# Patient Record
Sex: Male | Born: 1950 | Race: Black or African American | Hispanic: No | Marital: Married | Smoking: Never smoker
Health system: Southern US, Community
[De-identification: ages and names within clinical notes are randomized; demographics above are authoritative.]

## PROBLEM LIST (undated history)

## (undated) DIAGNOSIS — I10 Essential (primary) hypertension: Secondary | ICD-10-CM

## (undated) DIAGNOSIS — K219 Gastro-esophageal reflux disease without esophagitis: Secondary | ICD-10-CM

## (undated) DIAGNOSIS — N189 Chronic kidney disease, unspecified: Secondary | ICD-10-CM

## (undated) DIAGNOSIS — E785 Hyperlipidemia, unspecified: Secondary | ICD-10-CM

## (undated) DIAGNOSIS — D494 Neoplasm of unspecified behavior of bladder: Secondary | ICD-10-CM

---

## 2002-04-16 ENCOUNTER — Encounter: Admission: RE | Admit: 2002-04-16 | Discharge: 2002-07-15 | Payer: Self-pay | Admitting: Family Medicine

## 2012-04-21 ENCOUNTER — Other Ambulatory Visit (HOSPITAL_COMMUNITY)
Admission: RE | Admit: 2012-04-21 | Discharge: 2012-04-21 | Disposition: A | Discharge status: Home / Self Care | Payer: Managed Care, Other (non HMO) | Source: Ambulatory Visit | Attending: Otolaryngology | Admitting: Otolaryngology

## 2012-04-21 DIAGNOSIS — R220 Localized swelling, mass and lump, head: Secondary | ICD-10-CM | POA: Insufficient documentation

## 2015-07-24 ENCOUNTER — Other Ambulatory Visit: Payer: Self-pay | Admitting: Nephrology

## 2015-07-24 DIAGNOSIS — N183 Chronic kidney disease, stage 3 unspecified: Secondary | ICD-10-CM

## 2015-07-27 ENCOUNTER — Ambulatory Visit
Admission: RE | Admit: 2015-07-27 | Discharge: 2015-07-27 | Disposition: A | Discharge status: Home / Self Care | Payer: 59 | Source: Ambulatory Visit | Attending: Nephrology | Admitting: Nephrology

## 2015-07-27 DIAGNOSIS — N183 Chronic kidney disease, stage 3 unspecified: Secondary | ICD-10-CM

## 2017-07-19 IMAGING — US US RENAL
1 series · 14 of 25 positions shown · non-contrast
Comparison: None in PACs

CLINICAL DATA: Chronic renal insufficiency stage III

EXAM:
RENAL / URINARY TRACT ULTRASOUND COMPLETE

[Series 1: us renal · 0.26mm/px · 14 of 62 slices shown]
[im 1/62]
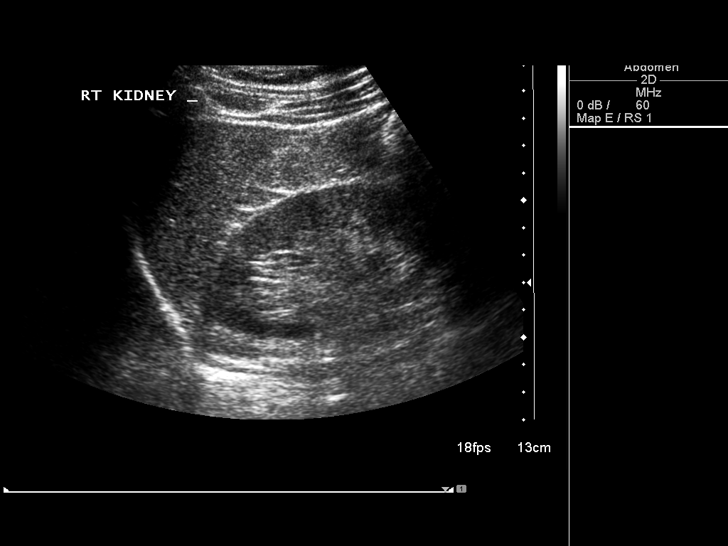
[im 6/62]
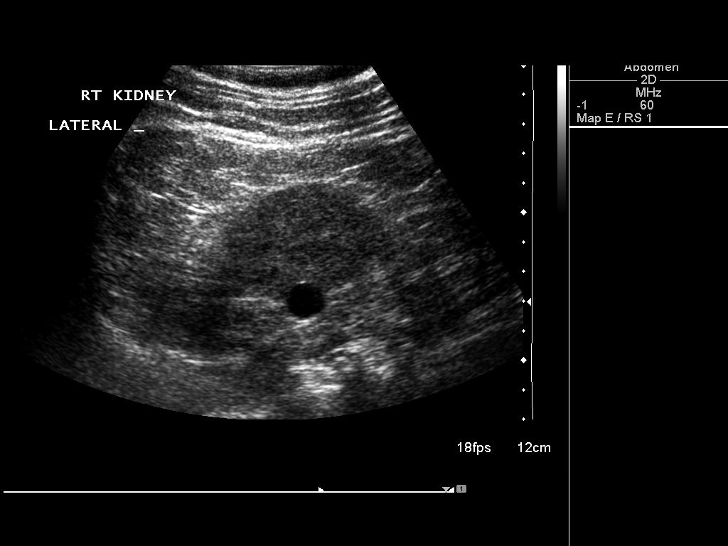
[im 11/62]
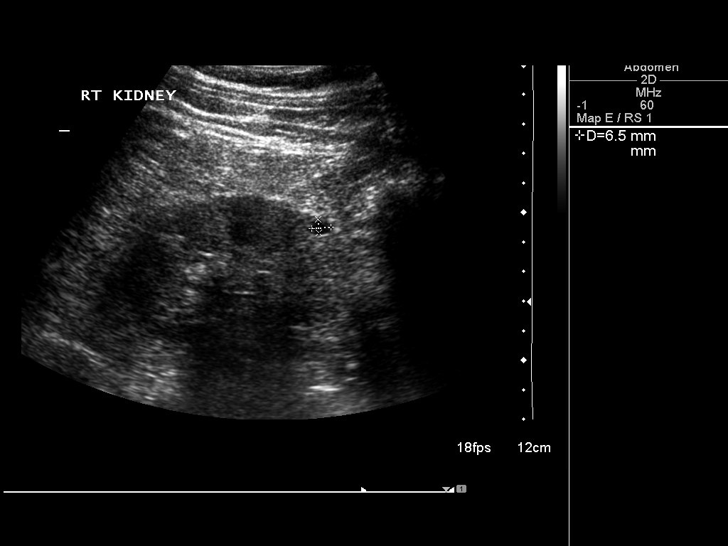
[im 16/62]
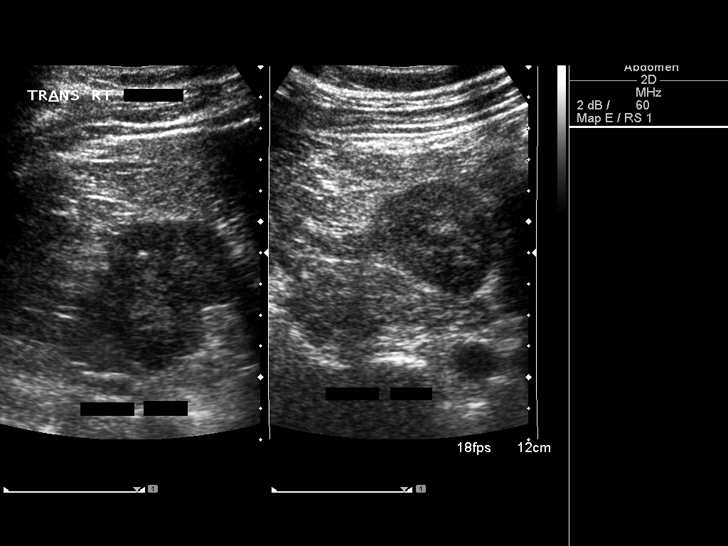
[im 21/62]
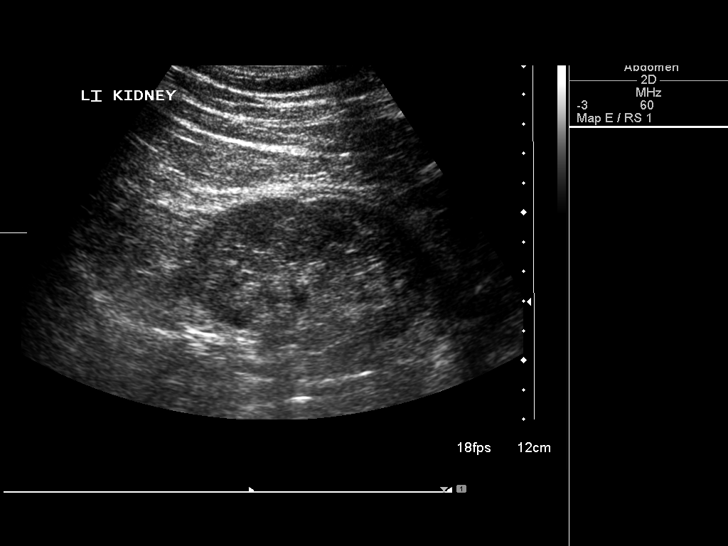
[im 23/62]
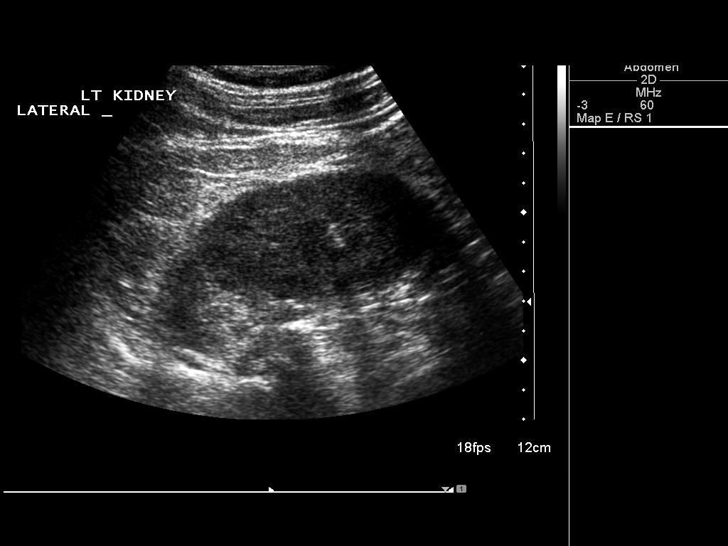
[im 28/62]
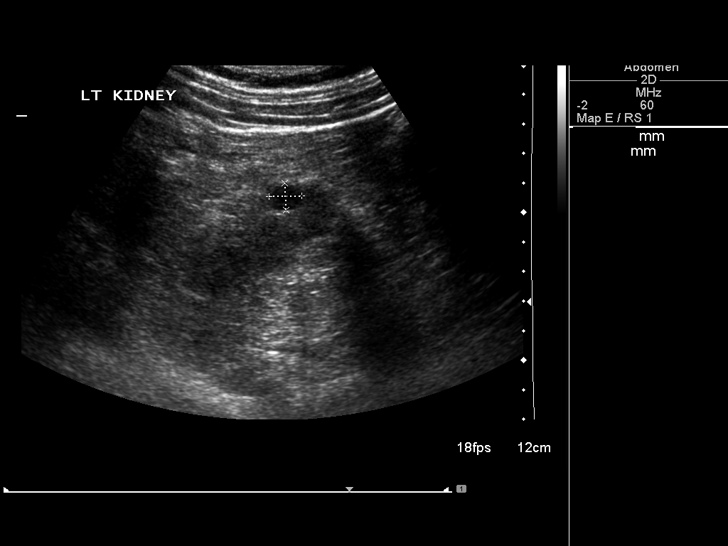
[im 34/62]
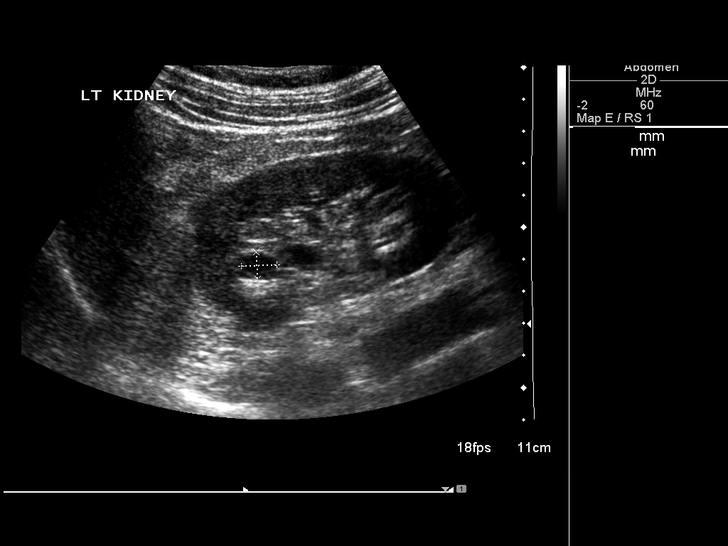
[im 39/62]
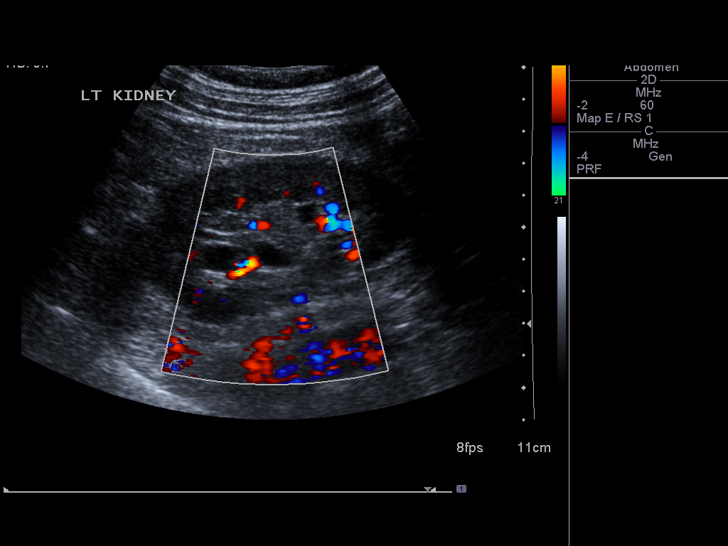
[im 41/62]
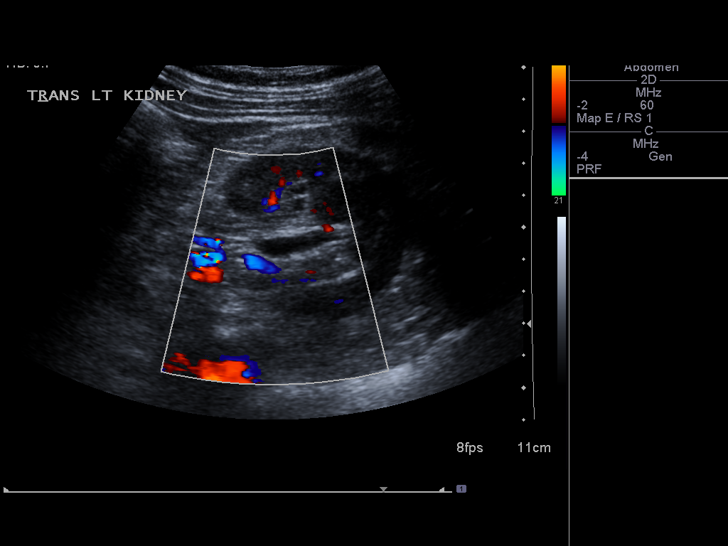
[im 46/62]
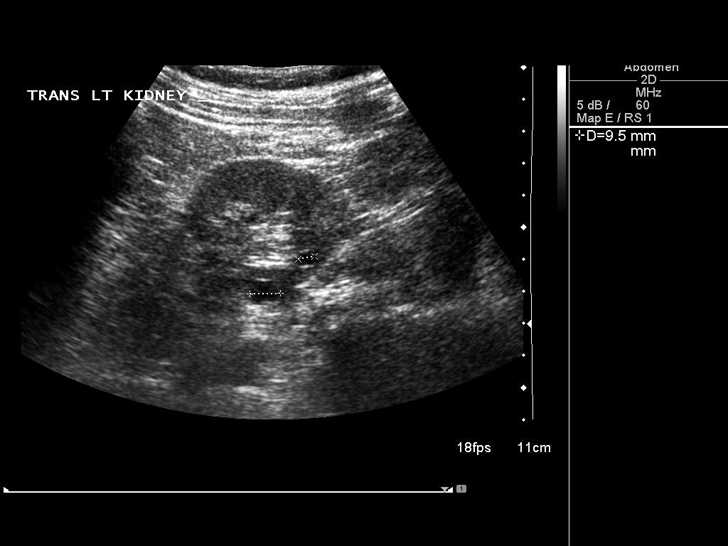
[im 51/62]
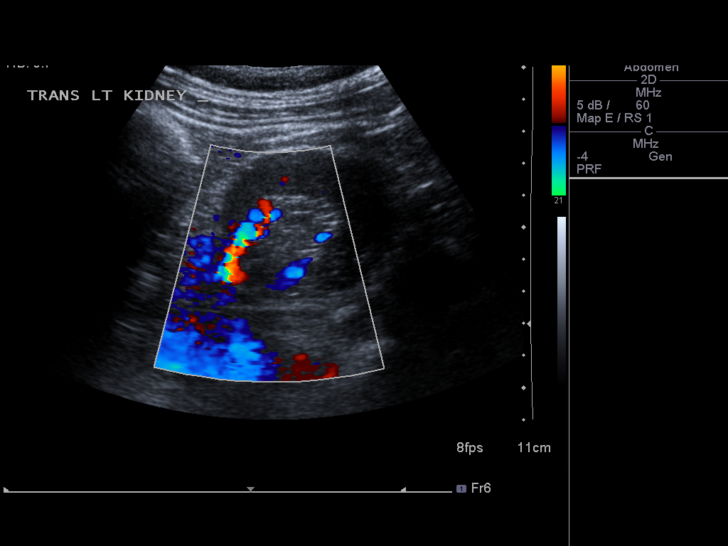
[im 56/62]
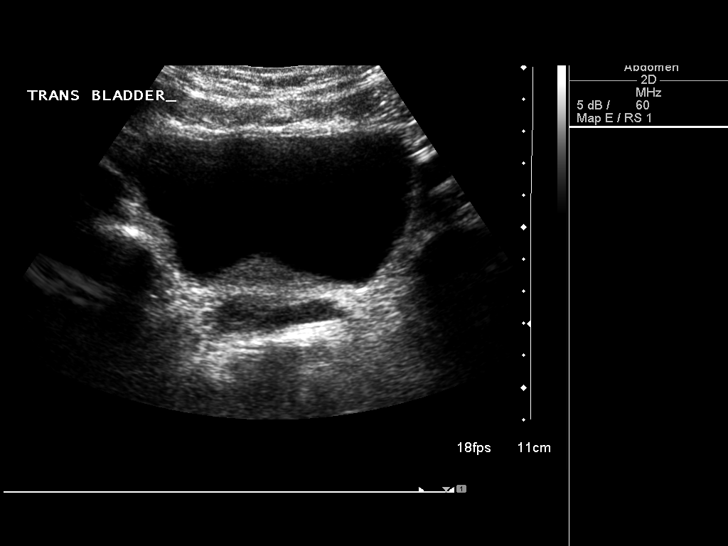
[im 62/62]
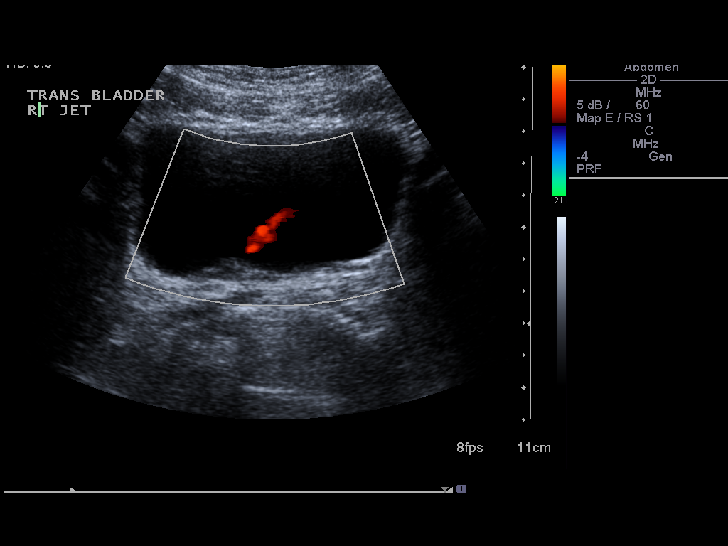

[14 of 25 positions shown; findings below may reference images not displayed]

FINDINGS: Right Kidney:

Length: 9.1 cm. The renal cortical echotexture remains lower than
that of the adjacent liver. There are 2 cortical cysts, 1 measuring
1.4 cm in greatest dimension in the other measuring 0.7 cm in
greatest dimension. There is no hydronephrosis.

Left Kidney:

Length: 9.3 cm. The renal cortical echotexture is similar to that of
the right kidney. There are cortical cysts. The largest measures
cm in diameter. There is no hydronephrosis.

Bladder:

Bilateral ureteral jets are observed. The partially distended
urinary bladder appears normal.
IMPRESSION: 1. The renal cortical echotexture remains lower than the adjacent
liver. There is no hydronephrosis.
2. There are simple appearing cysts in both kidneys measuring up to
1.4 cm in diameter.
3. The urinary bladder is unremarkable.

## 2019-04-02 ENCOUNTER — Ambulatory Visit: Payer: Medicare Other | Attending: Internal Medicine

## 2019-04-02 ENCOUNTER — Other Ambulatory Visit: Payer: Self-pay | Admitting: Urology

## 2019-04-02 DIAGNOSIS — Z23 Encounter for immunization: Secondary | ICD-10-CM

## 2019-04-02 NOTE — Progress Notes (Signed)
   Covid-19 Vaccination Clinic  Name:  LILA HRABE    MRN: ID:[id number] DOB: Nov 16, 1950  04/02/2019  Mr. Tomerlin was observed post Covid-19 immunization for 15 minutes without incident. He was provided with Vaccine Information Sheet and instruction to access the V-Safe system.   Mr. Persing was instructed to call 911 with any severe reactions post vaccine: Marland Kitchen Difficulty breathing  . Swelling of face and throat  . A fast heartbeat  . A bad rash all over body  . Dizziness and weakness   Immunizations Administered    Name Date Dose VIS Date Route   Pfizer COVID-19 Vaccine 04/02/2019 12:19 PM 0.3 mL 01/08/2019 Intramuscular   Manufacturer: [place]   Lot: [id number]   [place]: [id number]

## 2019-04-08 ENCOUNTER — Encounter (HOSPITAL_BASED_OUTPATIENT_CLINIC_OR_DEPARTMENT_OTHER): Payer: Self-pay | Admitting: Urology

## 2019-04-08 ENCOUNTER — Other Ambulatory Visit: Payer: Self-pay

## 2019-04-08 NOTE — Progress Notes (Signed)
Spoke w/ via phone for pre-op interview---Evan Ryan needs dos----   I stat 8, ekg             COVID test ------04-12-2019 at [facility] at -------915 am 04-15-2019 NPO after ------midnight Medications to take morning of surgery -----none Diabetic medication -----n/a Patient Special Instructions ----- Pre-Op special Istructions ----- Patient verbalized understanding of instructions that were given at this phone interview. Patient denies shortness of breath, chest pain, fever, cough a this phone interview.

## 2019-04-12 ENCOUNTER — Other Ambulatory Visit (HOSPITAL_COMMUNITY)
Admission: RE | Admit: 2019-04-12 | Discharge: 2019-04-12 | Disposition: A | Discharge status: Home / Self Care | Payer: Medicare Other | Source: Ambulatory Visit | Attending: Urology | Admitting: Urology

## 2019-04-12 DIAGNOSIS — Z20822 Contact with and (suspected) exposure to covid-19: Secondary | ICD-10-CM | POA: Insufficient documentation

## 2019-04-12 DIAGNOSIS — Z01812 Encounter for preprocedural laboratory examination: Secondary | ICD-10-CM | POA: Insufficient documentation

## 2019-04-12 LAB — SARS CORONAVIRUS 2 (TAT 6-24 HRS): SARS Coronavirus 2: NEGATIVE

## 2019-04-15 ENCOUNTER — Ambulatory Visit (HOSPITAL_BASED_OUTPATIENT_CLINIC_OR_DEPARTMENT_OTHER): Payer: Medicare Other | Admitting: Anesthesiology

## 2019-04-15 ENCOUNTER — Other Ambulatory Visit: Payer: Self-pay

## 2019-04-15 ENCOUNTER — Encounter (HOSPITAL_BASED_OUTPATIENT_CLINIC_OR_DEPARTMENT_OTHER): Payer: Self-pay | Admitting: Urology

## 2019-04-15 ENCOUNTER — Encounter (HOSPITAL_BASED_OUTPATIENT_CLINIC_OR_DEPARTMENT_OTHER)
Admission: RE | Disposition: A | Discharge status: Home / Self Care | Payer: Self-pay | Source: Home / Self Care | Attending: Urology

## 2019-04-15 ENCOUNTER — Ambulatory Visit (HOSPITAL_BASED_OUTPATIENT_CLINIC_OR_DEPARTMENT_OTHER)
Admission: RE | Admit: 2019-04-15 | Discharge: 2019-04-15 | Disposition: A | Discharge status: Home / Self Care | Payer: Medicare Other | Attending: Urology | Admitting: Urology

## 2019-04-15 DIAGNOSIS — R911 Solitary pulmonary nodule: Secondary | ICD-10-CM | POA: Insufficient documentation

## 2019-04-15 DIAGNOSIS — N3080 Other cystitis without hematuria: Secondary | ICD-10-CM | POA: Insufficient documentation

## 2019-04-15 DIAGNOSIS — K219 Gastro-esophageal reflux disease without esophagitis: Secondary | ICD-10-CM | POA: Diagnosis not present

## 2019-04-15 DIAGNOSIS — C672 Malignant neoplasm of lateral wall of bladder: Secondary | ICD-10-CM | POA: Diagnosis not present

## 2019-04-15 DIAGNOSIS — Z79899 Other long term (current) drug therapy: Secondary | ICD-10-CM | POA: Diagnosis not present

## 2019-04-15 DIAGNOSIS — I10 Essential (primary) hypertension: Secondary | ICD-10-CM | POA: Diagnosis not present

## 2019-04-15 DIAGNOSIS — E7800 Pure hypercholesterolemia, unspecified: Secondary | ICD-10-CM | POA: Diagnosis not present

## 2019-04-15 DIAGNOSIS — Z7982 Long term (current) use of aspirin: Secondary | ICD-10-CM | POA: Diagnosis not present

## 2019-04-15 DIAGNOSIS — R3129 Other microscopic hematuria: Secondary | ICD-10-CM | POA: Diagnosis present

## 2019-04-15 HISTORY — DX: Neoplasm of unspecified behavior of bladder: D49.4

## 2019-04-15 HISTORY — PX: CYSTOSCOPY W/ RETROGRADES: SHX1426

## 2019-04-15 HISTORY — PX: TRANSURETHRAL RESECTION OF BLADDER TUMOR WITH MITOMYCIN-C: SHX6459

## 2019-04-15 HISTORY — DX: Gastro-esophageal reflux disease without esophagitis: K21.9

## 2019-04-15 HISTORY — DX: Hyperlipidemia, unspecified: E78.5

## 2019-04-15 HISTORY — DX: Essential (primary) hypertension: I10

## 2019-04-15 HISTORY — DX: Chronic kidney disease, unspecified: N18.9

## 2019-04-15 LAB — POCT I-STAT, CHEM 8
BUN: 32 mg/dL — ABNORMAL HIGH (ref 8–23)
Calcium, Ion: 1.27 mmol/L (ref 1.15–1.40)
Chloride: 108 mmol/L (ref 98–111)
Creatinine, Ser: 1.8 mg/dL — ABNORMAL HIGH (ref 0.61–1.24)
Glucose, Bld: 109 mg/dL — ABNORMAL HIGH (ref 70–99)
HCT: 42 % (ref 39.0–52.0)
Hemoglobin: 14.3 g/dL (ref 13.0–17.0)
Potassium: 3.6 mmol/L (ref 3.5–5.1)
Sodium: 144 mmol/L (ref 135–145)
TCO2: 30 mmol/L (ref 22–32)

## 2019-04-15 SURGERY — TRANSURETHRAL RESECTION OF BLADDER TUMOR WITH MITOMYCIN-C
Anesthesia: General | Site: Pelvis

## 2019-04-15 MED ORDER — BELLADONNA ALKALOIDS-OPIUM 16.2-60 MG RE SUPP
RECTAL | Status: DC | PRN
  Administered 2019-04-15: 1 via RECTAL

## 2019-04-15 MED ORDER — FENTANYL CITRATE (PF) 100 MCG/2ML IJ SOLN
25.0000 ug | INTRAMUSCULAR | Status: DC | PRN
  Filled 2019-04-15: qty 1

## 2019-04-15 MED ORDER — ACETAMINOPHEN 160 MG/5ML PO SOLN
325.0000 mg | ORAL | Status: DC | PRN
  Filled 2019-04-15: qty 20.3

## 2019-04-15 MED ORDER — CIPROFLOXACIN IN D5W 400 MG/200ML IV SOLN
400.0000 mg | INTRAVENOUS | Status: AC
  Administered 2019-04-15: 400 mg via INTRAVENOUS
  Filled 2019-04-15: qty 200

## 2019-04-15 MED ORDER — CIPROFLOXACIN IN D5W 400 MG/200ML IV SOLN
INTRAVENOUS | Status: AC
  Filled 2019-04-15: qty 200

## 2019-04-15 MED ORDER — PROPOFOL 10 MG/ML IV BOLUS
INTRAVENOUS | Status: DC | PRN
  Administered 2019-04-15: 50 mg via INTRAVENOUS
  Administered 2019-04-15: 150 mg via INTRAVENOUS

## 2019-04-15 MED ORDER — LIDOCAINE 2% (20 MG/ML) 5 ML SYRINGE
INTRAMUSCULAR | Status: AC
  Filled 2019-04-15: qty 10

## 2019-04-15 MED ORDER — GEMCITABINE CHEMO FOR BLADDER INSTILLATION 2000 MG
2000.0000 mg | Freq: Once | INTRAVENOUS | Status: AC
  Administered 2019-04-15: 2000 mg via INTRAVESICAL
  Filled 2019-04-15: qty 2000

## 2019-04-15 MED ORDER — OXYCODONE HCL 5 MG/5ML PO SOLN
5.0000 mg | Freq: Once | ORAL | Status: DC | PRN
  Filled 2019-04-15: qty 5

## 2019-04-15 MED ORDER — OXYCODONE HCL 5 MG PO TABS
5.0000 mg | ORAL_TABLET | Freq: Once | ORAL | Status: DC | PRN
  Filled 2019-04-15: qty 1

## 2019-04-15 MED ORDER — SUCCINYLCHOLINE CHLORIDE 200 MG/10ML IV SOSY
PREFILLED_SYRINGE | INTRAVENOUS | Status: DC | PRN
  Administered 2019-04-15: 100 mg via INTRAVENOUS

## 2019-04-15 MED ORDER — IOHEXOL 300 MG/ML  SOLN
INTRAMUSCULAR | Status: DC | PRN
  Administered 2019-04-15: 12:00:00 12 mL via URETHRAL

## 2019-04-15 MED ORDER — SODIUM CHLORIDE 0.9 % IV SOLN
INTRAVENOUS | Status: DC
  Filled 2019-04-15: qty 1000

## 2019-04-15 MED ORDER — ONDANSETRON HCL 4 MG/2ML IJ SOLN
INTRAMUSCULAR | Status: DC | PRN
  Administered 2019-04-15: 4 mg via INTRAVENOUS

## 2019-04-15 MED ORDER — SODIUM CHLORIDE 0.9 % IR SOLN
Status: DC | PRN
  Administered 2019-04-15: 6000 mL via INTRAVESICAL

## 2019-04-15 MED ORDER — PHENAZOPYRIDINE HCL 200 MG PO TABS: 200.0000 mg | ORAL_TABLET | Freq: Three times a day (TID) | ORAL | 0 refills | Status: AC | PRN

## 2019-04-15 MED ORDER — MEPERIDINE HCL 25 MG/ML IJ SOLN
6.2500 mg | INTRAMUSCULAR | Status: DC | PRN
  Filled 2019-04-15: qty 1

## 2019-04-15 MED ORDER — FENTANYL CITRATE (PF) 100 MCG/2ML IJ SOLN
INTRAMUSCULAR | Status: AC
  Filled 2019-04-15: qty 2

## 2019-04-15 MED ORDER — ONDANSETRON HCL 4 MG/2ML IJ SOLN
4.0000 mg | Freq: Once | INTRAMUSCULAR | Status: DC | PRN
  Filled 2019-04-15: qty 2

## 2019-04-15 MED ORDER — DEXAMETHASONE SODIUM PHOSPHATE 10 MG/ML IJ SOLN
INTRAMUSCULAR | Status: DC | PRN
  Administered 2019-04-15: 5 mg via INTRAVENOUS

## 2019-04-15 MED ORDER — TRAMADOL HCL 50 MG PO TABS: 50.0000 mg | ORAL_TABLET | Freq: Four times a day (QID) | ORAL | 0 refills | Status: AC | PRN

## 2019-04-15 MED ORDER — BELLADONNA ALKALOIDS-OPIUM 16.2-60 MG RE SUPP
RECTAL | Status: AC
  Filled 2019-04-15: qty 1

## 2019-04-15 MED ORDER — FENTANYL CITRATE (PF) 100 MCG/2ML IJ SOLN
INTRAMUSCULAR | Status: DC | PRN
  Administered 2019-04-15 (×3): 25 ug via INTRAVENOUS

## 2019-04-15 MED ORDER — LIDOCAINE 2% (20 MG/ML) 5 ML SYRINGE
INTRAMUSCULAR | Status: DC | PRN
  Administered 2019-04-15: 100 mg via INTRAVENOUS

## 2019-04-15 MED ORDER — PROPOFOL 10 MG/ML IV BOLUS
INTRAVENOUS | Status: AC
  Filled 2019-04-15: qty 20

## 2019-04-15 MED ORDER — DEXAMETHASONE SODIUM PHOSPHATE 10 MG/ML IJ SOLN
INTRAMUSCULAR | Status: AC
  Filled 2019-04-15: qty 1

## 2019-04-15 MED ORDER — ACETAMINOPHEN 325 MG PO TABS
325.0000 mg | ORAL_TABLET | ORAL | Status: DC | PRN
  Filled 2019-04-15: qty 2

## 2019-04-15 MED ORDER — GEMCITABINE CHEMO FOR BLADDER INSTILLATION 2000 MG: 2000.0000 mg | Freq: Once | INTRAVENOUS | Status: DC

## 2019-04-15 MED ORDER — ONDANSETRON HCL 4 MG/2ML IJ SOLN
INTRAMUSCULAR | Status: AC
  Filled 2019-04-15: qty 2

## 2019-04-15 SURGICAL SUPPLY — 27 items
BAG DRAIN URO-CYSTO SKYTR STRL (DRAIN) ×4 IMPLANT
BAG URINE DRAIN 2000ML AR STRL (UROLOGICAL SUPPLIES) ×4 IMPLANT
BASKET LASER NITINOL 1.9FR (BASKET) IMPLANT
CATH FOLEY 3WAY 30CC 22FR (CATHETERS) ×4 IMPLANT
CATH URET 5FR 28IN OPEN ENDED (CATHETERS) ×4 IMPLANT
CATH URET DUAL LUMEN 6-10FR 50 (CATHETERS) IMPLANT
CLOTH BEACON ORANGE TIMEOUT ST (SAFETY) IMPLANT
ELECT REM PT RETURN 9FT ADLT (ELECTROSURGICAL) ×4
ELECTRODE REM PT RTRN 9FT ADLT (ELECTROSURGICAL) ×2 IMPLANT
EVACUATOR MICROVAS BLADDER (UROLOGICAL SUPPLIES) IMPLANT
EXTRACTOR STONE 1.7FRX115CM (UROLOGICAL SUPPLIES) IMPLANT
FIBER LASER TRAC TIP (UROLOGICAL SUPPLIES) IMPLANT
GLOVE BIO SURGEON STRL SZ7.5 (GLOVE) ×4 IMPLANT
GOWN STRL REUS W/ TWL XL LVL3 (GOWN DISPOSABLE) ×4 IMPLANT
GOWN STRL REUS W/TWL XL LVL3 (GOWN DISPOSABLE) ×10 IMPLANT
GUIDEWIRE STR DUAL SENSOR (WIRE) ×4 IMPLANT
HOLDER FOLEY CATH W/STRAP (MISCELLANEOUS) ×4 IMPLANT
IV NS IRRIG 3000ML ARTHROMATIC (IV SOLUTION) ×8 IMPLANT
KIT TURNOVER CYSTO (KITS) ×4 IMPLANT
MANIFOLD NEPTUNE II (INSTRUMENTS) ×4 IMPLANT
NS IRRIG 500ML POUR BTL (IV SOLUTION) ×4 IMPLANT
PACK CYSTO (CUSTOM PROCEDURE TRAY) ×4 IMPLANT
SYR 30ML LL (SYRINGE) IMPLANT
TUBE CONNECTING 12'X1/4 (SUCTIONS) ×1
TUBE CONNECTING 12X1/4 (SUCTIONS) ×3 IMPLANT
TUBING UROLOGY SET (TUBING) ×4 IMPLANT
WATER STERILE IRR 500ML POUR (IV SOLUTION) ×4 IMPLANT

## 2019-04-15 NOTE — Interval H&P Note (Signed)
History and Physical Interval Note:  04/15/2019 10:41 AM  Evan Ryan  has presented today for surgery, with the diagnosis of BLADDER TUMOR.  The various methods of treatment have been discussed with the patient and family. After consideration of risks, benefits and other options for treatment, the patient has consented to  Procedure(s): TRANSURETHRAL RESECTION OF BLADDER TUMOR WITH GEMCITABINE (N/A) CYSTOSCOPY WITH RETROGRADE PYELOGRAM (Bilateral) as a surgical intervention.  The patient's history has been reviewed, patient examined, no change in status, stable for surgery.  I have reviewed the patient's chart and labs.  Questions were answered to the patient's satisfaction.     Ardis Hughs

## 2019-04-15 NOTE — H&P (Signed)
Microscopic hematuria evaluation  HPI: Evan Ryan is a 69 year-old male established patient who is here for further evaluation of microscopic hematuria.  The patient was last seen Two weeks prior.   The patient did not have cystoscopy at their last visit.   He has had a CT scan within the last 12 months.   The patient denies any progression of his voiding symptoms. The patient denies any new or recent onset of back/flank pain or suprapubic pain.   The patient does not have a history of recurrent UTIs. The patient has no family history of GU malignancy.   The patient has seen noted blood in his urine since the last visit.   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 There was a 9 mm enhancing mass in the patient's right posterior bladder. There was a 6 mm pulmonary nodule.     ALLERGIES: No Allergies    MEDICATIONS: Aspirin Ec 81 mg tablet, delayed release Oral  Cialis 20 mg tablet Oral  Lisinopril-Hydrochlorothiazide 20 mg-25 mg tablet Oral  Pantoprazole Sodium 40 mg tablet, delayed release Oral  Simvastatin 80 mg tablet Oral     GU PSH: Locm 300-399Mg /Ml Iodine,1Ml - 03/25/2019       PSH Notes: No Surgical Problems   NON-GU PSH: None   GU PMH: Gross hematuria - 03/12/2019 Urethral Disorders Other, Urethral bleeding - 2015    NON-GU PMH: Encounter for general adult medical examination without abnormal findings, Encounter for preventive health examination - 2015 Personal history of other diseases of the circulatory system, History of hypertension - 2015 Personal history of other diseases of the digestive system, History of esophageal reflux - 2015 Personal history of other endocrine, nutritional and metabolic disease, History of hypercholesterolemia - 2015 GERD Hypercholesterolemia Hypertension    FAMILY HISTORY: Heart problem - Runs In Family Kidney Stones - Brother Strokes - Runs In Family   SOCIAL HISTORY: Marital Status:  Married Preferred Language: English; Ethnicity: Not Hispanic Or Latino; Race: Black or African American Current Smoking Status: Patient has never smoked.   Tobacco Use Assessment Completed: Used Tobacco in last 30 days? Does not use smokeless tobacco. Does drink.  Does not use drugs. Does not drink caffeine.     Notes: Alcohol use, Never a smoker, Married, Occupation, Caffeine use, Death in the family, mother, Death in the family, father   REVIEW OF SYSTEMS:    GU Review Male:   Patient denies frequent urination, hard to postpone urination, burning/ pain with urination, get up at night to urinate, leakage of urine, stream starts and stops, trouble starting your stream, have to strain to urinate , erection problems, and penile pain.  Gastrointestinal (Upper):   Patient denies nausea, vomiting, and indigestion/ heartburn.  Gastrointestinal (Lower):   Patient denies constipation and diarrhea.  Constitutional:   Patient denies fever, night sweats, weight loss, and fatigue.  Skin:   Patient denies skin rash/ lesion and itching.  Eyes:   Patient denies blurred vision and double vision.  Ears/ Nose/ Throat:   Patient denies sore throat and sinus problems.  Hematologic/Lymphatic:   Patient denies swollen glands and easy bruising.  Cardiovascular:   Patient denies leg swelling and chest pains.  Respiratory:   Patient denies cough and shortness of breath.  Endocrine:   Patient denies excessive thirst.  Musculoskeletal:   Patient denies back pain and joint pain.  Neurological:   Patient denies headaches and dizziness.  Psychologic:   Patient denies depression and anxiety.   VITAL  SIGNS:      03/30/2019 02:16 PM  Weight 146 lb / 66.22 kg  Height 64 in / 162.56 cm  BP 152/84 mmHg  Pulse 97 /min  Temperature 98.0 F / 36.6 C  BMI 25.1 kg/m   GU PHYSICAL EXAMINATION:    Scrotum: No lesions. No edema. No cysts. No warts.  Epididymides: Right: no spermatocele, no masses, no cysts, no  tenderness, no induration, no enlargement. Left: no spermatocele, no masses, no cysts, no tenderness, no induration, no enlargement.  Testes: No tenderness, no swelling, no enlargement left testes. No tenderness, no swelling, no enlargement right testes. Normal location left testes. Normal location right testes. No mass, no cyst, no varicocele, no hydrocele left testes. No mass, no cyst, no varicocele, no hydrocele right testes.  Urethral Meatus: Normal size. No lesion, no wart, no discharge, no polyp. Normal location.  Penis: Circumcised, no warts, no cracks. No dorsal Peyronie's plaques, no left corporal Peyronie's plaques, no right corporal Peyronie's plaques, no scarring, no warts. No balanitis, no meatal stenosis.   MULTI-SYSTEM PHYSICAL EXAMINATION:    Constitutional: Well-nourished. No physical deformities. Normally developed. Good grooming.  Respiratory: Normal breath sounds. No labored breathing, no use of accessory muscles.   Cardiovascular: Regular rate and rhythm. No murmur, no gallop. Normal temperature, normal extremity pulses, no swelling, no varicosities.      PAST DATA REVIEWED:  Source Of History:  Patient  Records Review:   Previous Doctor Records, Previous Patient Records, POC Tool  Urine Test Review:   Urinalysis  X-Ray Review: C.T. Abdomen/Pelvis: Reviewed Films. Discussed With Patient.     PROCEDURES:         Flexible Cystoscopy - 52000  Risks, benefits, and some of the potential complications of the procedure were discussed at length with the patient including infection, bleeding, voiding discomfort, urinary retention, fever, chills, sepsis, and others. All questions were answered. Informed consent was obtained. Sterile technique and intraurethral analgesia were used.  Meatus:  Normal size. Normal location. Normal condition.  Urethra:  No strictures.  External Sphincter:  Normal.  Verumontanum:  Normal.  Prostate:  Obstructing. Moderate hyperplasia.  Bladder Neck:   Non-obstructing.  Ureteral Orifices:  Normal location. Normal size. Normal shape. Effluxed clear urine.  Bladder:  The patient has 2 small bladder tumors, each measuring less than 1 cm, and located on the right posterior wall of the patient's bladder      The lower urinary tract was carefully examined. The procedure was well-tolerated and without complications. Antibiotic instructions were given. Instructions were given to call the office immediately for bloody urine, difficulty urinating, urinary retention, painful or frequent urination, fever, chills, nausea, vomiting or other illness. The patient stated that he understood these instructions and would comply with them.         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 I recommended a TURBT and bilateral retrograde pyelograms. I went through the surgery with him in detail and answered all his questions. The patient also has papillary necrosis bilaterally. I do not see any real risk factors for this, but the patient has already established with Nephrology. I will alert them to these findings.

## 2019-04-15 NOTE — Anesthesia Procedure Notes (Signed)
Procedure Name: LMA Insertion Date/Time: 04/15/2019 10:55 AM Performed by: Justice Rocher, CRNA Pre-anesthesia Checklist: Patient identified, Emergency Drugs available, Suction available and Patient being monitored Patient Re-evaluated:Patient Re-evaluated prior to induction Oxygen Delivery Method: Circle system utilized Preoxygenation: Pre-oxygenation with 100% oxygen Induction Type: IV induction Ventilation: Mask ventilation without difficulty LMA: LMA inserted LMA Size: 4.0 Number of attempts: 1 Airway Equipment and Method: Bite block Placement Confirmation: positive ETCO2,  CO2 detector and breath sounds checked- equal and bilateral Tube secured with: Tape Dental Injury: Teeth and Oropharynx as per pre-operative assessment

## 2019-04-15 NOTE — Discharge Instructions (Signed)
Transurethral Resection of Bladder Tumor (TURBT) or Bladder Biopsy ° ° °Definition: ° Transurethral Resection of the Bladder Tumor is a surgical procedure used to diagnose and remove tumors within the bladder. TURBT is the most common treatment for early stage bladder cancer. ° °General instructions: °   ° Your recent bladder surgery requires very little post hospital care but some definite precautions. ° °Despite the fact that no skin incisions were used, the area around the bladder incisions are raw and covered with scabs to promote healing and prevent bleeding. Certain precautions are needed to insure that the scabs are not disturbed over the next 2-4 weeks while the healing proceeds. ° °Because the raw surface inside your bladder and the irritating effects of urine you may expect frequency of urination and/or urgency (a stronger desire to urinate) and perhaps even getting up at night more often. This will usually resolve or improve slowly over the healing period. You may see some blood in your urine over the first 6 weeks. Do not be alarmed, even if the urine was clear for a while. Get off your feet and drink lots of fluids until clearing occurs. If you start to pass clots or don't improve call us. ° °Diet: ° °You may return to your normal diet immediately. Because of the raw surface of your bladder, alcohol, spicy foods, foods high in acid and drinks with caffeine may cause irritation or frequency and should be used in moderation. To keep your urine flowing freely and avoid constipation, drink plenty of fluids during the day (8-10 glasses). Tip: Avoid cranberry juice because it is very acidic. ° °Activity: ° °Your physical activity doesn't need to be restricted. However, if you are very active, you may see some blood in the urine. We suggest that you reduce your activity under the circumstances until the bleeding has stopped. ° °Bowels: ° °It is important to keep your bowels regular during the postoperative  period. Straining with bowel movements can cause bleeding. A bowel movement every other day is reasonable. Use a mild laxative if needed, such as milk of magnesia 2-3 tablespoons, or 2 Dulcolax tablets. Call if you continue to have problems. If you had been taking narcotics for pain, before, during or after your surgery, you may be constipated. Take a laxative if necessary. ° ° ° °Medication: ° °You should resume your pre-surgery medications unless told not to. In addition you may be given an antibiotic to prevent or treat infection. Antibiotics are not always necessary. All medication should be taken as prescribed until the bottles are finished unless you are having an unusual reaction to one of the drugs. ° ° ° ° ° °Post Anesthesia Home Care Instructions ° °Activity: °Get plenty of rest for the remainder of the day. A responsible individual must stay with you for 24 hours following the procedure.  °For the next 24 hours, DO NOT: °-Drive a car °-Operate machinery °-Drink alcoholic beverages °-Take any medication unless instructed by your physician °-Make any legal decisions or sign important papers. ° °Meals: °Start with liquid foods such as gelatin or soup. Progress to regular foods as tolerated. Avoid greasy, spicy, heavy foods. If nausea and/or vomiting occur, drink only clear liquids until the nausea and/or vomiting subsides. Call your physician if vomiting continues. ° °Special Instructions/Symptoms: °Your throat may feel dry or sore from the anesthesia or the breathing tube placed in your throat during surgery. If this causes discomfort, gargle with warm salt water. The discomfort should disappear within 24   hours. ° °If you had a scopolamine patch placed behind your ear for the management of post- operative nausea and/or vomiting: ° °1. The medication in the patch is effective for 72 hours, after which it should be removed.  Wrap patch in a tissue and discard in the trash. Wash hands thoroughly with soap and  water. °2. You may remove the patch earlier than 72 hours if you experience unpleasant side effects which may include dry mouth, dizziness or visual disturbances. °3. Avoid touching the patch. Wash your hands with soap and water after contact with the patch. °   ° ° ° °

## 2019-04-15 NOTE — Transfer of Care (Signed)
Immediate Anesthesia Transfer of Care Note  Patient: Evan Ryan  Procedure(s) Performed: TRANSURETHRAL RESECTION OF BLADDER TUMOR WITH GEMCITABINE (N/A Bladder) CYSTOSCOPY WITH RETROGRADE PYELOGRAM (Bilateral Pelvis)  Patient Location: PACU  Anesthesia Type:General  Level of Consciousness: awake, drowsy and responds to stimulation  Airway & Oxygen Therapy: Patient Spontanous Breathing and Patient connected to nasal cannula oxygen  Post-op Assessment: Report given to RN and Post -op Vital signs reviewed and stable  Post vital signs: Reviewed and stable  Last Vitals:  Vitals Value Taken Time  BP 118/84 04/15/19 1145  Temp    Pulse 71 04/15/19 1146  Resp 15 04/15/19 1146  SpO2 100 % 04/15/19 1146  Vitals shown include unvalidated device data.  Last Pain:  Vitals:   04/15/19 0933  TempSrc: Oral         Complications: No apparent anesthesia complications

## 2019-04-15 NOTE — Interval H&P Note (Signed)
History and Physical Interval Note:  04/15/2019 10:40 AM  Evan Ryan  has presented today for surgery, with the diagnosis of BLADDER TUMOR.  The various methods of treatment have been discussed with the patient and family. After consideration of risks, benefits and other options for treatment, the patient has consented to  Procedure(s): TRANSURETHRAL RESECTION OF BLADDER TUMOR WITH GEMCITABINE (N/A) CYSTOSCOPY WITH RETROGRADE PYELOGRAM (Bilateral) as a surgical intervention.  The patient's history has been reviewed, patient examined, no change in status, stable for surgery.  I have reviewed the patient's chart and labs.  Questions were answered to the patient's satisfaction.     Ardis Hughs

## 2019-04-15 NOTE — Anesthesia Preprocedure Evaluation (Addendum)
Anesthesia Evaluation  Patient identified by MRN, date of birth, ID band Patient awake    Reviewed: Allergy & Precautions, NPO status , Patient's Chart, lab work & pertinent test results  Airway Mallampati: II  TM Distance: >3 FB Neck ROM: Full    Dental no notable dental hx. (+) Teeth Intact, Dental Advisory Given   Pulmonary neg pulmonary ROS,    Pulmonary exam normal breath sounds clear to auscultation       Cardiovascular hypertension, Pt. on medications Normal cardiovascular exam Rhythm:Regular Rate:Normal     Neuro/Psych negative neurological ROS  negative psych ROS   GI/Hepatic Neg liver ROS, GERD  Medicated,  Endo/Other  negative endocrine ROS  Renal/GU CRFRenal disease  negative genitourinary   Musculoskeletal negative musculoskeletal ROS (+)   Abdominal   Peds negative pediatric ROS (+)  Hematology negative hematology ROS (+)   Anesthesia Other Findings   Reproductive/Obstetrics negative OB ROS                            Anesthesia Physical Anesthesia Plan  ASA: II  Anesthesia Plan: General   Post-op Pain Management:    Induction: Intravenous  PONV Risk Score and Plan: 2 and Dexamethasone  Airway Management Planned: LMA  Additional Equipment:   Intra-op Plan:   Post-operative Plan:   Informed Consent:   Plan Discussed with: CRNA, Surgeon and Anesthesiologist  Anesthesia Plan Comments: ( )        Anesthesia Quick Evaluation

## 2019-04-15 NOTE — Op Note (Signed)
Preoperative diagnosis:  Bladder cancer, right lateral wall 2cm   Postoperative diagnosis:  same   Procedure: Transurethral resection of bladder tumor, 2 cm Bilateral retrograde pyelogram with interpretation Postoperative gemcitabine intravesical instillation  Surgeon: Ardis Hughs, MD  Anesthesia: General  Complications: None  Intraoperative findings:  #1: The patient had 2 tumors on the right lateral wall that were low-grade appearing on a fairly narrow stalk that were easily removed.  There was some mucosal abnormality at the trigone region which I also biopsied. #2: The patient's right retrograde pyelogram was performed with a 5 [place] open-ended ureteral catheter and demonstrated normal caliber ureter with no filling defects or hydroureteronephrosis and the calyces were sharp. #3: The patient's left retrograde pyelogram was performed with the 5 [place] open-ended ureteral catheter demonstrated normal caliber ureter with no filling defects or hydroureteronephrosis and sharp calyces.   EBL: Minimal  Specimens: #1: Right lateral wall bladder tumors, #2: Trigonal biopsy  Indication: Evan Ryan is a 69 y.o. patient with history of gross hematuria, and in clinic cystoscopy demonstrated 2 small tumors on the right lateral wall of his bladder.  After reviewing the management options for treatment, he elected to proceed with the above surgical procedure(s). We have discussed the potential benefits and risks of the procedure, side effects of the proposed treatment, the likelihood of the patient achieving the goals of the procedure, and any potential problems that might occur during the procedure or recuperation. Informed consent has been obtained.  Description of procedure:  The patient was taken to the operating room and general anesthesia was induced.  The patient was placed in the dorsal lithotomy position, prepped and draped in the usual sterile fashion, and preoperative  antibiotics were administered. A preoperative time-out was performed.   A 21 French 30 degree cystoscope was gently passed through the patient's urethra and into the bladder under visual guidance.  Cystoscopy was performed with the above findings.  I then performed retrograde pyelograms with the above findings using Omnipaque contrast.  I then exchanged the 21 French sheath for the 26 French sheath using the visual obturator.  The obturator was exchanged for the loop cautery and a TUR was performed of the right bladder tumors as well as the trigone biopsy.  Hemostasis was excellent.  I subsequently removed the scope and exchanged for an 79 French Foley catheter.  10 cc of water was placed in the patient's Foley balloon and the E flux was noted to be clear.  I placed a B&O suppository in the patient's rectum and performed an exam under anesthesia which was normal.  The patient was subsequently extubated return the PACU in stable condition.  In the PACU, ~84mL's with 2gm of Gemcitabine was instilled into the patient's bladder through an 38 French Foley catheter.  This was allowed to dwell for 60-90 minutes prior to emptying the Foley catheter and removing it.   Ardis Hughs, M.D.

## 2019-04-16 LAB — SURGICAL PATHOLOGY

## 2019-04-18 NOTE — Anesthesia Postprocedure Evaluation (Signed)
Anesthesia Post Note  Patient: Evan Ryan  Procedure(s) Performed: TRANSURETHRAL RESECTION OF BLADDER TUMOR WITH GEMCITABINE (N/A Bladder) CYSTOSCOPY WITH RETROGRADE PYELOGRAM (Bilateral Pelvis)     Patient location during evaluation: PACU Anesthesia Type: General Level of consciousness: awake and alert Pain management: pain level controlled Vital Signs Assessment: post-procedure vital signs reviewed and stable Respiratory status: spontaneous breathing, nonlabored ventilation, respiratory function stable and patient connected to nasal cannula oxygen Cardiovascular status: blood pressure returned to baseline and stable Postop Assessment: no apparent nausea or vomiting Anesthetic complications: no    Last Vitals:  Vitals:   04/15/19 1315 04/15/19 1330  BP: 124/76 125/79  Pulse: 63 61  Resp: 15 12  Temp:    SpO2: 95% 97%    Last Pain:  Vitals:   04/15/19 1315  TempSrc:   PainSc: 0-No pain   Pain Goal:                   Keithan Dileonardo

## 2019-04-28 ENCOUNTER — Ambulatory Visit: Payer: Medicare Other | Attending: Internal Medicine

## 2019-04-28 DIAGNOSIS — Z23 Encounter for immunization: Secondary | ICD-10-CM

## 2019-04-28 NOTE — Progress Notes (Signed)
   Covid-19 Vaccination Clinic  Name:  REMINGTON ARCO    MRN: ID:[id number] DOB: 1950/12/01  04/28/2019  Mr. Bostelman was observed post Covid-19 immunization for 15 minutes without incident. He was provided with Vaccine Information Sheet and instruction to access the V-Safe system.   Mr. Blinder was instructed to call 911 with any severe reactions post vaccine: Marland Kitchen Difficulty breathing  . Swelling of face and throat  . A fast heartbeat  . A bad rash all over body  . Dizziness and weakness   Immunizations Administered    Name Date Dose VIS Date Route   Pfizer COVID-19 Vaccine 04/28/2019 12:36 PM 0.3 mL 01/08/2019 Intramuscular   Manufacturer: [place]   Lot: [id number]   [place]: [id number]

## 2019-11-27 ENCOUNTER — Ambulatory Visit: Payer: Medicare Other | Attending: Internal Medicine

## 2019-11-27 DIAGNOSIS — Z23 Encounter for immunization: Secondary | ICD-10-CM

## 2019-11-27 NOTE — Progress Notes (Signed)
   Covid-19 Vaccination Clinic  Name:  Evan Ryan    MRN: [id number] DOB: 07-25-1950  11/27/2019  [name] was observed post Covid-19 immunization for 15 minutes without incident. He was provided with Vaccine Information Sheet and instruction to access the V-Safe system.   [name] was instructed to call 911 with any severe reactions post vaccine: Marland Kitchen Difficulty breathing  . Swelling of face and throat  . A fast heartbeat  . A bad rash all over body  . Dizziness and weakness

## 2020-06-27 DIAGNOSIS — N189 Chronic kidney disease, unspecified: Secondary | ICD-10-CM | POA: Diagnosis not present

## 2020-06-27 DIAGNOSIS — I129 Hypertensive chronic kidney disease with stage 1 through stage 4 chronic kidney disease, or unspecified chronic kidney disease: Secondary | ICD-10-CM | POA: Diagnosis not present

## 2020-06-27 DIAGNOSIS — C679 Malignant neoplasm of bladder, unspecified: Secondary | ICD-10-CM | POA: Diagnosis not present

## 2020-07-10 DIAGNOSIS — C672 Malignant neoplasm of lateral wall of bladder: Secondary | ICD-10-CM | POA: Diagnosis not present

## 2020-08-17 DIAGNOSIS — Z8551 Personal history of malignant neoplasm of bladder: Secondary | ICD-10-CM | POA: Diagnosis not present

## 2020-08-17 DIAGNOSIS — E785 Hyperlipidemia, unspecified: Secondary | ICD-10-CM | POA: Diagnosis not present

## 2020-08-17 DIAGNOSIS — N183 Chronic kidney disease, stage 3 unspecified: Secondary | ICD-10-CM | POA: Diagnosis not present

## 2020-08-17 DIAGNOSIS — I10 Essential (primary) hypertension: Secondary | ICD-10-CM | POA: Diagnosis not present

## 2020-08-17 DIAGNOSIS — K219 Gastro-esophageal reflux disease without esophagitis: Secondary | ICD-10-CM | POA: Diagnosis not present

## 2020-08-17 DIAGNOSIS — Z0000 Encounter for general adult medical examination without abnormal findings: Secondary | ICD-10-CM | POA: Diagnosis not present

## 2020-11-07 ENCOUNTER — Ambulatory Visit: Payer: Self-pay | Admitting: *Deleted

## 2020-11-07 NOTE — Telephone Encounter (Signed)
Patient called in needing to ask that nurse a question about an appointment that may or may not be in conflict with a Covid booster vaccine. Can be reached at Ph# [phone]   Patient has concerns about getting booster and dental appointment- he recalls being asked if he had current dental appointment- or something to that nature- patient just wants to make sure it is safe to get vaccine a couple days before he goes to dentist. Patient advised not aware of any pre dental warning for COVID booster- advised may call dentist for concerns as well. Reason for Disposition . General information question, no triage required and triager able to answer question  Protocols used: Information Only Call - No Triage-A-AH

## 2020-12-01 DIAGNOSIS — Z23 Encounter for immunization: Secondary | ICD-10-CM | POA: Diagnosis not present

## 2020-12-04 ENCOUNTER — Other Ambulatory Visit: Payer: Self-pay

## 2020-12-04 ENCOUNTER — Other Ambulatory Visit (HOSPITAL_BASED_OUTPATIENT_CLINIC_OR_DEPARTMENT_OTHER): Payer: Self-pay

## 2020-12-04 ENCOUNTER — Ambulatory Visit: Payer: Medicare Other | Attending: Internal Medicine

## 2020-12-04 DIAGNOSIS — Z23 Encounter for immunization: Secondary | ICD-10-CM

## 2020-12-04 MED ORDER — PFIZER COVID-19 VAC BIVALENT 30 MCG/0.3ML IM SUSP
INTRAMUSCULAR | 0 refills | Status: AC
  Filled 2020-12-04: qty 0.3, 1d supply, fill #0

## 2020-12-04 NOTE — Progress Notes (Signed)
   Covid-19 Vaccination Clinic  Name:  Evan WILLIAMSON    MRN: [id number] DOB: 1950-10-19  12/04/2020  Mr. Class was observed post Covid-19 immunization for 15 minutes without incident. He was provided with Vaccine Information Sheet and instruction to access the V-Safe system.   Mr. Jasinski was instructed to call 911 with any severe reactions post vaccine: Difficulty breathing  Swelling of face and throat  A fast heartbeat  A bad rash all over body  Dizziness and weakness   Immunizations Administered     Name Date Dose VIS Date Route   Pfizer Covid-19 Vaccine Bivalent Booster 12/04/2020 11:03 AM 0.3 mL 09/27/2020 Intramuscular   Manufacturer: [place]   Lot: [id number]   [place]: [phone]

## 2021-01-09 DIAGNOSIS — C672 Malignant neoplasm of lateral wall of bladder: Secondary | ICD-10-CM | POA: Diagnosis not present

## 2021-05-02 DIAGNOSIS — H2513 Age-related nuclear cataract, bilateral: Secondary | ICD-10-CM | POA: Diagnosis not present

## 2021-05-02 DIAGNOSIS — H52223 Regular astigmatism, bilateral: Secondary | ICD-10-CM | POA: Diagnosis not present

## 2021-05-02 DIAGNOSIS — H43812 Vitreous degeneration, left eye: Secondary | ICD-10-CM | POA: Diagnosis not present

## 2021-05-02 DIAGNOSIS — H5213 Myopia, bilateral: Secondary | ICD-10-CM | POA: Diagnosis not present

## 2021-05-02 DIAGNOSIS — H04123 Dry eye syndrome of bilateral lacrimal glands: Secondary | ICD-10-CM | POA: Diagnosis not present

## 2021-05-02 DIAGNOSIS — H524 Presbyopia: Secondary | ICD-10-CM | POA: Diagnosis not present

## 2021-06-12 DIAGNOSIS — C679 Malignant neoplasm of bladder, unspecified: Secondary | ICD-10-CM | POA: Diagnosis not present

## 2021-06-12 DIAGNOSIS — I129 Hypertensive chronic kidney disease with stage 1 through stage 4 chronic kidney disease, or unspecified chronic kidney disease: Secondary | ICD-10-CM | POA: Diagnosis not present

## 2021-06-12 DIAGNOSIS — N189 Chronic kidney disease, unspecified: Secondary | ICD-10-CM | POA: Diagnosis not present

## 2021-07-09 DIAGNOSIS — C672 Malignant neoplasm of lateral wall of bladder: Secondary | ICD-10-CM | POA: Diagnosis not present

## 2021-09-03 DIAGNOSIS — M25469 Effusion, unspecified knee: Secondary | ICD-10-CM | POA: Diagnosis not present

## 2021-09-03 DIAGNOSIS — N183 Chronic kidney disease, stage 3 unspecified: Secondary | ICD-10-CM | POA: Diagnosis not present

## 2021-09-03 DIAGNOSIS — Z0000 Encounter for general adult medical examination without abnormal findings: Secondary | ICD-10-CM | POA: Diagnosis not present

## 2021-09-03 DIAGNOSIS — K30 Functional dyspepsia: Secondary | ICD-10-CM | POA: Diagnosis not present

## 2021-09-03 DIAGNOSIS — I10 Essential (primary) hypertension: Secondary | ICD-10-CM | POA: Diagnosis not present

## 2021-09-03 DIAGNOSIS — Z8551 Personal history of malignant neoplasm of bladder: Secondary | ICD-10-CM | POA: Diagnosis not present

## 2021-09-03 DIAGNOSIS — E785 Hyperlipidemia, unspecified: Secondary | ICD-10-CM | POA: Diagnosis not present

## 2021-09-03 DIAGNOSIS — D649 Anemia, unspecified: Secondary | ICD-10-CM | POA: Diagnosis not present

## 2021-09-03 DIAGNOSIS — K219 Gastro-esophageal reflux disease without esophagitis: Secondary | ICD-10-CM | POA: Diagnosis not present

## 2021-09-04 DIAGNOSIS — Z0000 Encounter for general adult medical examination without abnormal findings: Secondary | ICD-10-CM | POA: Diagnosis not present

## 2021-09-06 DIAGNOSIS — M7051 Other bursitis of knee, right knee: Secondary | ICD-10-CM | POA: Diagnosis not present

## 2021-11-26 DIAGNOSIS — J029 Acute pharyngitis, unspecified: Secondary | ICD-10-CM | POA: Diagnosis not present

## 2022-01-02 ENCOUNTER — Other Ambulatory Visit (HOSPITAL_BASED_OUTPATIENT_CLINIC_OR_DEPARTMENT_OTHER): Payer: Self-pay

## 2022-01-02 MED ORDER — COMIRNATY 30 MCG/0.3ML IM SUSY
PREFILLED_SYRINGE | INTRAMUSCULAR | 0 refills | Status: AC
  Filled 2022-01-02: qty 0.3, 1d supply, fill #0

## 2022-01-02 MED ORDER — INFLUENZA VAC SPLIT QUAD 0.5 ML IM SUSY
PREFILLED_SYRINGE | INTRAMUSCULAR | 0 refills | Status: AC
  Filled 2022-01-02: qty 0.5, 1d supply, fill #0

## 2022-01-08 DIAGNOSIS — C672 Malignant neoplasm of lateral wall of bladder: Secondary | ICD-10-CM | POA: Diagnosis not present

## 2022-06-14 DIAGNOSIS — E7800 Pure hypercholesterolemia, unspecified: Secondary | ICD-10-CM | POA: Diagnosis not present

## 2022-06-14 DIAGNOSIS — N189 Chronic kidney disease, unspecified: Secondary | ICD-10-CM | POA: Diagnosis not present

## 2022-06-14 DIAGNOSIS — I129 Hypertensive chronic kidney disease with stage 1 through stage 4 chronic kidney disease, or unspecified chronic kidney disease: Secondary | ICD-10-CM | POA: Diagnosis not present

## 2022-06-14 DIAGNOSIS — J302 Other seasonal allergic rhinitis: Secondary | ICD-10-CM | POA: Diagnosis not present

## 2022-07-15 DIAGNOSIS — C672 Malignant neoplasm of lateral wall of bladder: Secondary | ICD-10-CM | POA: Diagnosis not present

## 2022-08-23 ENCOUNTER — Other Ambulatory Visit (HOSPITAL_COMMUNITY): Payer: Self-pay

## 2022-09-16 DIAGNOSIS — E785 Hyperlipidemia, unspecified: Secondary | ICD-10-CM | POA: Diagnosis not present

## 2022-09-16 DIAGNOSIS — Z0000 Encounter for general adult medical examination without abnormal findings: Secondary | ICD-10-CM | POA: Diagnosis not present

## 2022-09-16 DIAGNOSIS — Z8551 Personal history of malignant neoplasm of bladder: Secondary | ICD-10-CM | POA: Diagnosis not present

## 2022-09-16 DIAGNOSIS — K219 Gastro-esophageal reflux disease without esophagitis: Secondary | ICD-10-CM | POA: Diagnosis not present

## 2022-09-16 DIAGNOSIS — I10 Essential (primary) hypertension: Secondary | ICD-10-CM | POA: Diagnosis not present

## 2022-09-16 DIAGNOSIS — Z5181 Encounter for therapeutic drug level monitoring: Secondary | ICD-10-CM | POA: Diagnosis not present

## 2022-09-16 DIAGNOSIS — N1831 Chronic kidney disease, stage 3a: Secondary | ICD-10-CM | POA: Diagnosis not present

## 2022-10-08 DIAGNOSIS — H5213 Myopia, bilateral: Secondary | ICD-10-CM | POA: Diagnosis not present

## 2022-10-08 DIAGNOSIS — H524 Presbyopia: Secondary | ICD-10-CM | POA: Diagnosis not present

## 2022-10-08 DIAGNOSIS — H43812 Vitreous degeneration, left eye: Secondary | ICD-10-CM | POA: Diagnosis not present

## 2022-10-08 DIAGNOSIS — H2513 Age-related nuclear cataract, bilateral: Secondary | ICD-10-CM | POA: Diagnosis not present

## 2022-10-08 DIAGNOSIS — H52223 Regular astigmatism, bilateral: Secondary | ICD-10-CM | POA: Diagnosis not present

## 2022-10-08 DIAGNOSIS — H04123 Dry eye syndrome of bilateral lacrimal glands: Secondary | ICD-10-CM | POA: Diagnosis not present

## 2023-01-13 DIAGNOSIS — C672 Malignant neoplasm of lateral wall of bladder: Secondary | ICD-10-CM | POA: Diagnosis not present

## 2023-01-28 ENCOUNTER — Other Ambulatory Visit (HOSPITAL_BASED_OUTPATIENT_CLINIC_OR_DEPARTMENT_OTHER): Payer: Self-pay

## 2023-01-28 MED ORDER — FLUAD 0.5 ML IM SUSY
0.5000 mL | PREFILLED_SYRINGE | Freq: Once | INTRAMUSCULAR | 0 refills | Status: AC
  Filled 2023-01-28: qty 0.5, 1d supply, fill #0

## 2023-06-17 DIAGNOSIS — E7800 Pure hypercholesterolemia, unspecified: Secondary | ICD-10-CM | POA: Diagnosis not present

## 2023-06-17 DIAGNOSIS — J302 Other seasonal allergic rhinitis: Secondary | ICD-10-CM | POA: Diagnosis not present

## 2023-06-17 DIAGNOSIS — I129 Hypertensive chronic kidney disease with stage 1 through stage 4 chronic kidney disease, or unspecified chronic kidney disease: Secondary | ICD-10-CM | POA: Diagnosis not present

## 2023-06-17 DIAGNOSIS — N189 Chronic kidney disease, unspecified: Secondary | ICD-10-CM | POA: Diagnosis not present

## 2023-07-14 DIAGNOSIS — C672 Malignant neoplasm of lateral wall of bladder: Secondary | ICD-10-CM | POA: Diagnosis not present

## 2023-10-06 DIAGNOSIS — L309 Dermatitis, unspecified: Secondary | ICD-10-CM | POA: Diagnosis not present

## 2023-10-06 DIAGNOSIS — K219 Gastro-esophageal reflux disease without esophagitis: Secondary | ICD-10-CM | POA: Diagnosis not present

## 2023-10-06 DIAGNOSIS — Z8551 Personal history of malignant neoplasm of bladder: Secondary | ICD-10-CM | POA: Diagnosis not present

## 2023-10-06 DIAGNOSIS — E785 Hyperlipidemia, unspecified: Secondary | ICD-10-CM | POA: Diagnosis not present

## 2023-10-06 DIAGNOSIS — Z23 Encounter for immunization: Secondary | ICD-10-CM | POA: Diagnosis not present

## 2023-10-06 DIAGNOSIS — N1831 Chronic kidney disease, stage 3a: Secondary | ICD-10-CM | POA: Diagnosis not present

## 2023-10-06 DIAGNOSIS — Z0000 Encounter for general adult medical examination without abnormal findings: Secondary | ICD-10-CM | POA: Diagnosis not present

## 2023-10-06 DIAGNOSIS — I10 Essential (primary) hypertension: Secondary | ICD-10-CM | POA: Diagnosis not present

## 2024-01-15 ENCOUNTER — Other Ambulatory Visit (HOSPITAL_BASED_OUTPATIENT_CLINIC_OR_DEPARTMENT_OTHER): Payer: Self-pay

## 2024-01-15 MED ORDER — FLUZONE HIGH-DOSE 0.5 ML IM SUSY
0.5000 mL | PREFILLED_SYRINGE | Freq: Once | INTRAMUSCULAR | 0 refills | Status: AC
  Filled 2024-01-15: qty 0.5, 1d supply, fill #0
